# Patient Record
Sex: Female | Born: 1945 | Race: White | Hispanic: No | Marital: Single
Health system: Southern US, Community
[De-identification: ages and names within clinical notes are randomized; demographics above are authoritative.]

---

## 2004-05-31 ENCOUNTER — Emergency Department: Payer: Self-pay | Admitting: Emergency Medicine

## 2004-06-07 ENCOUNTER — Ambulatory Visit: Payer: Self-pay | Admitting: Unknown Physician Specialty

## 2004-11-08 ENCOUNTER — Emergency Department: Payer: Self-pay | Admitting: Emergency Medicine

## 2005-11-17 ENCOUNTER — Emergency Department: Payer: Self-pay | Admitting: Emergency Medicine

## 2006-02-09 ENCOUNTER — Emergency Department (HOSPITAL_COMMUNITY): Admission: EM | Admit: 2006-02-09 | Discharge: 2006-02-09 | Payer: Self-pay | Admitting: Emergency Medicine

## 2006-02-10 ENCOUNTER — Emergency Department: Payer: Self-pay | Admitting: Emergency Medicine

## 2006-09-01 ENCOUNTER — Ambulatory Visit: Payer: Self-pay | Admitting: Cardiology

## 2006-09-01 ENCOUNTER — Inpatient Hospital Stay (HOSPITAL_COMMUNITY): Admission: EM | Admit: 2006-09-01 | Discharge: 2006-09-10 | Payer: Self-pay | Admitting: Emergency Medicine

## 2006-09-09 ENCOUNTER — Ambulatory Visit: Payer: Self-pay | Admitting: Physical Medicine & Rehabilitation

## 2006-10-22 ENCOUNTER — Emergency Department: Payer: Self-pay | Admitting: Emergency Medicine

## 2006-10-22 ENCOUNTER — Other Ambulatory Visit: Payer: Self-pay

## 2006-12-02 ENCOUNTER — Inpatient Hospital Stay: Payer: Self-pay | Admitting: Internal Medicine

## 2006-12-27 ENCOUNTER — Emergency Department: Payer: Self-pay | Admitting: Emergency Medicine

## 2007-12-09 ENCOUNTER — Ambulatory Visit: Payer: Self-pay | Admitting: Pain Medicine

## 2008-05-28 ENCOUNTER — Emergency Department: Payer: Self-pay | Admitting: Emergency Medicine

## 2008-05-29 ENCOUNTER — Emergency Department: Payer: Self-pay | Admitting: Emergency Medicine

## 2008-05-31 ENCOUNTER — Emergency Department: Payer: Self-pay | Admitting: Emergency Medicine

## 2008-06-01 ENCOUNTER — Emergency Department: Payer: Self-pay | Admitting: Emergency Medicine

## 2008-09-02 ENCOUNTER — Emergency Department: Payer: Self-pay | Admitting: Emergency Medicine

## 2010-03-21 ENCOUNTER — Emergency Department: Payer: Self-pay | Admitting: Emergency Medicine

## 2010-11-18 ENCOUNTER — Emergency Department: Payer: Self-pay | Admitting: Emergency Medicine

## 2010-11-21 ENCOUNTER — Emergency Department: Payer: Self-pay | Admitting: Unknown Physician Specialty

## 2011-04-28 ENCOUNTER — Emergency Department: Payer: Self-pay | Admitting: Emergency Medicine

## 2011-06-01 ENCOUNTER — Emergency Department: Payer: Self-pay | Admitting: Emergency Medicine

## 2011-08-19 ENCOUNTER — Emergency Department: Payer: Self-pay | Admitting: Emergency Medicine

## 2011-08-29 ENCOUNTER — Emergency Department: Payer: Self-pay | Admitting: Emergency Medicine

## 2011-08-29 LAB — URINALYSIS, COMPLETE
Glucose,UR: NEGATIVE mg/dL (ref 0–75)
Ketone: NEGATIVE
Protein: NEGATIVE
RBC,UR: 2 /HPF (ref 0–5)
Specific Gravity: 1.02 (ref 1.003–1.030)
WBC UR: <1 /HPF (ref 0–5)

## 2011-08-29 LAB — COMPREHENSIVE METABOLIC PANEL
Calcium, Total: 8.3 mg/dL — ABNORMAL LOW (ref 8.5–10.1)
Chloride: 110 mmol/L — ABNORMAL HIGH (ref 98–107)
Co2: 21 mmol/L (ref 21–32)
EGFR (Non-African Amer.): >60
Glucose: 82 mg/dL (ref 65–99)
Osmolality: 285 (ref 275–301)
Potassium: 4 mmol/L (ref 3.5–5.1)
SGOT(AST): 27 U/L (ref 15–37)
Sodium: 143 mmol/L (ref 136–145)

## 2011-08-29 LAB — CBC WITH DIFFERENTIAL/PLATELET
Eosinophil #: 0.1 10*3/uL (ref 0.0–0.7)
Eosinophil %: 3 %
HGB: 10.9 g/dL — ABNORMAL LOW (ref 12.0–16.0)
Lymphocyte #: 1.1 10*3/uL (ref 1.0–3.6)
Lymphocyte %: 23.5 %
Monocyte #: 0.4 10*3/uL (ref 0.0–0.7)
Neutrophil %: 63.8 %
Platelet: 293 10*3/uL (ref 150–440)
RBC: 3.52 10*6/uL — ABNORMAL LOW (ref 3.80–5.20)

## 2011-08-29 LAB — CK TOTAL AND CKMB (NOT AT ARMC): CK-MB: 3.8 ng/mL — ABNORMAL HIGH (ref 0.5–3.6)

## 2011-08-29 LAB — TROPONIN I: Troponin-I: <0.02 ng/mL

## 2011-09-14 ENCOUNTER — Emergency Department: Payer: Self-pay | Admitting: Emergency Medicine

## 2011-09-14 LAB — BASIC METABOLIC PANEL
Anion Gap: 9 (ref 7–16)
Chloride: 111 mmol/L — ABNORMAL HIGH (ref 98–107)
Creatinine: 0.72 mg/dL (ref 0.60–1.30)
EGFR (African American): >60
Osmolality: 274 (ref 275–301)

## 2011-09-14 LAB — CBC
HCT: 35.1 % (ref 35.0–47.0)
HGB: 11.3 g/dL — ABNORMAL LOW (ref 12.0–16.0)
MCV: 96 fL (ref 80–100)
Platelet: 440 10*3/uL (ref 150–440)
RBC: 3.68 10*6/uL — ABNORMAL LOW (ref 3.80–5.20)
WBC: 5.5 10*3/uL (ref 3.6–11.0)

## 2012-03-04 ENCOUNTER — Emergency Department: Payer: Self-pay | Admitting: Emergency Medicine

## 2012-03-04 LAB — CBC
HCT: 31.9 % — ABNORMAL LOW (ref 35.0–47.0)
HGB: 10.8 g/dL — ABNORMAL LOW (ref 12.0–16.0)
MCHC: 33.8 g/dL (ref 32.0–36.0)

## 2012-03-04 LAB — COMPREHENSIVE METABOLIC PANEL
Anion Gap: 7 (ref 7–16)
BUN: 15 mg/dL (ref 7–18)
Bilirubin,Total: 0.2 mg/dL (ref 0.2–1.0)
Chloride: 107 mmol/L (ref 98–107)
Creatinine: 0.7 mg/dL (ref 0.60–1.30)
EGFR (African American): >60
Osmolality: 275 (ref 275–301)
Potassium: 3.4 mmol/L — ABNORMAL LOW (ref 3.5–5.1)
SGOT(AST): 15 U/L (ref 15–37)
Sodium: 137 mmol/L (ref 136–145)
Total Protein: 7.1 g/dL (ref 6.4–8.2)

## 2012-03-04 LAB — CK TOTAL AND CKMB (NOT AT ARMC)
CK, Total: 33 U/L (ref 21–215)
CK-MB: 1.5 ng/mL (ref 0.5–3.6)

## 2012-03-11 LAB — CBC WITH DIFFERENTIAL/PLATELET
Basophil %: 2.8 %
Eosinophil %: 3.6 %
HCT: 29.9 % — ABNORMAL LOW (ref 35.0–47.0)
MCHC: 31.5 g/dL — ABNORMAL LOW (ref 32.0–36.0)
Monocyte #: 0.5 x10 3/mm (ref 0.2–0.9)
Monocyte %: 6.5 %
Neutrophil %: 68.6 %
RDW: 21.4 % — ABNORMAL HIGH (ref 11.5–14.5)
WBC: 8.2 10*3/uL (ref 3.6–11.0)

## 2012-03-12 ENCOUNTER — Inpatient Hospital Stay: Payer: Self-pay | Admitting: Specialist

## 2012-03-12 LAB — COMPREHENSIVE METABOLIC PANEL
Alkaline Phosphatase: 91 U/L (ref 50–136)
Anion Gap: 5 — ABNORMAL LOW (ref 7–16)
Chloride: 111 mmol/L — ABNORMAL HIGH (ref 98–107)
Co2: 26 mmol/L (ref 21–32)
EGFR (African American): >60
SGOT(AST): 21 U/L (ref 15–37)
SGPT (ALT): 12 U/L (ref 12–78)

## 2012-03-12 LAB — URINALYSIS, COMPLETE
Bilirubin,UR: NEGATIVE
Nitrite: NEGATIVE
Ph: 5 (ref 4.5–8.0)
Protein: NEGATIVE
RBC,UR: 2 /HPF (ref 0–5)
Squamous Epithelial: 1

## 2012-03-12 LAB — PROTIME-INR: Prothrombin Time: 12.5 secs (ref 11.5–14.7)

## 2012-03-12 LAB — ETHANOL: Ethanol %: <0.003 % (ref 0.000–0.080)

## 2012-03-28 ENCOUNTER — Emergency Department: Payer: Self-pay | Admitting: Emergency Medicine

## 2012-03-28 LAB — CBC WITH DIFFERENTIAL/PLATELET
Basophil %: 1.2 %
Eosinophil %: 0.2 %
HCT: 31.3 % — ABNORMAL LOW (ref 35.0–47.0)
Lymphocyte %: 10.6 %
MCH: 29.5 pg (ref 26.0–34.0)
MCHC: 31.9 g/dL — ABNORMAL LOW (ref 32.0–36.0)
MCV: 93 fL (ref 80–100)
Monocyte %: 5.5 %
RDW: 21.4 % — ABNORMAL HIGH (ref 11.5–14.5)

## 2012-03-28 LAB — COMPREHENSIVE METABOLIC PANEL
Albumin: 2.9 g/dL — ABNORMAL LOW (ref 3.4–5.0)
Alkaline Phosphatase: 141 U/L — ABNORMAL HIGH (ref 50–136)
Anion Gap: 10 (ref 7–16)
Calcium, Total: 7.8 mg/dL — ABNORMAL LOW (ref 8.5–10.1)
Co2: 20 mmol/L — ABNORMAL LOW (ref 21–32)
EGFR (Non-African Amer.): 53 — ABNORMAL LOW
Glucose: 98 mg/dL (ref 65–99)
Osmolality: 284 (ref 275–301)
Potassium: 3.2 mmol/L — ABNORMAL LOW (ref 3.5–5.1)
SGOT(AST): 24 U/L (ref 15–37)
Sodium: 140 mmol/L (ref 136–145)

## 2012-03-28 LAB — DRUG SCREEN, URINE
Amphetamines, Ur Screen: NEGATIVE (ref ?–1000)
Barbiturates, Ur Screen: POSITIVE (ref ?–200)
Cocaine Metabolite,Ur ~~LOC~~: NEGATIVE (ref ?–300)
MDMA (Ecstasy)Ur Screen: NEGATIVE (ref ?–500)
Opiate, Ur Screen: NEGATIVE (ref ?–300)
Tricyclic, Ur Screen: NEGATIVE (ref ?–1000)

## 2012-03-29 ENCOUNTER — Emergency Department: Payer: Self-pay | Admitting: Internal Medicine

## 2012-03-31 ENCOUNTER — Emergency Department: Payer: Self-pay | Admitting: Emergency Medicine

## 2012-04-02 ENCOUNTER — Emergency Department: Payer: Self-pay | Admitting: Emergency Medicine

## 2012-04-02 LAB — URINALYSIS, COMPLETE
Leukocyte Esterase: NEGATIVE
Ph: 5 (ref 4.5–8.0)
Protein: NEGATIVE
RBC,UR: 6 /HPF (ref 0–5)

## 2012-04-02 LAB — COMPREHENSIVE METABOLIC PANEL
Anion Gap: 9 (ref 7–16)
Bilirubin,Total: 0.1 mg/dL — ABNORMAL LOW (ref 0.2–1.0)
Calcium, Total: 8.2 mg/dL — ABNORMAL LOW (ref 8.5–10.1)
Chloride: 107 mmol/L (ref 98–107)
Co2: 25 mmol/L (ref 21–32)
EGFR (African American): >60
EGFR (Non-African Amer.): >60
Osmolality: 280 (ref 275–301)

## 2012-04-02 LAB — CBC
HCT: 31.3 % — ABNORMAL LOW (ref 35.0–47.0)
HGB: 10.3 g/dL — ABNORMAL LOW (ref 12.0–16.0)
MCH: 30.4 pg (ref 26.0–34.0)
MCHC: 32.9 g/dL (ref 32.0–36.0)
RDW: 21.5 % — ABNORMAL HIGH (ref 11.5–14.5)

## 2012-04-02 LAB — DRUG SCREEN, URINE
Benzodiazepine, Ur Scrn: POSITIVE (ref ?–200)
Cannabinoid 50 Ng, Ur ~~LOC~~: NEGATIVE (ref ?–50)
MDMA (Ecstasy)Ur Screen: NEGATIVE (ref ?–500)
Tricyclic, Ur Screen: NEGATIVE (ref ?–1000)

## 2012-08-06 ENCOUNTER — Emergency Department: Payer: Self-pay | Admitting: Emergency Medicine

## 2012-10-04 DEATH — deceased

## 2014-04-20 IMAGING — CT CT ABD-PELV W/ CM
1 of 3 series · 13 of 32 positions shown, 18 images · IV contrast (isovue)
Comparison: none

REASON FOR EXAM: (1) abd pain; (2) abd paiin
COMMENTS:

PROCEDURE:     CT  - CT ABDOMEN / PELVIS  W  - March 04, 2012  [DATE]
RESULT:     Comparison:  12/06/2006
TECHNIQUE: Multiple axial images of the abdomen and pelvis were performed
from the lung bases to the pubic symphysis, without p.o. contrast and with
85 mL of Isovue 300 intravenous contrast. The patient was unable to tolerate
oral contrast.

[Series 2: 3mm soft tissue · axial · 0.65mm/px · z∈[-426,-98]mm · 13 of 123 slices shown, 18 images]
[im 7/123  soft-tissue]
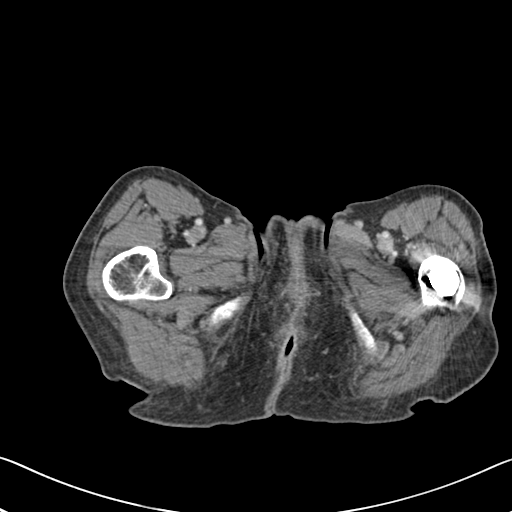
[im 7/123  bone]
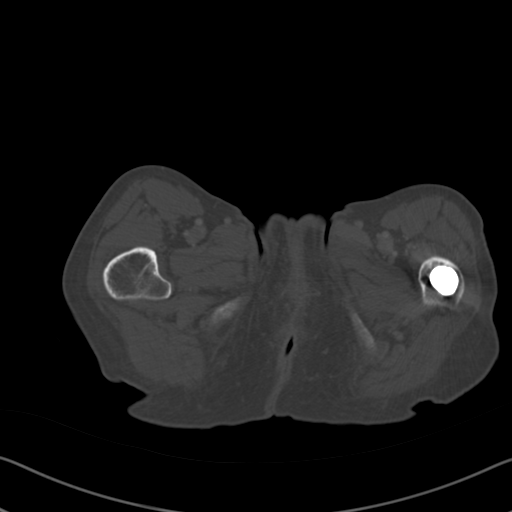
[im 21/123  soft-tissue]
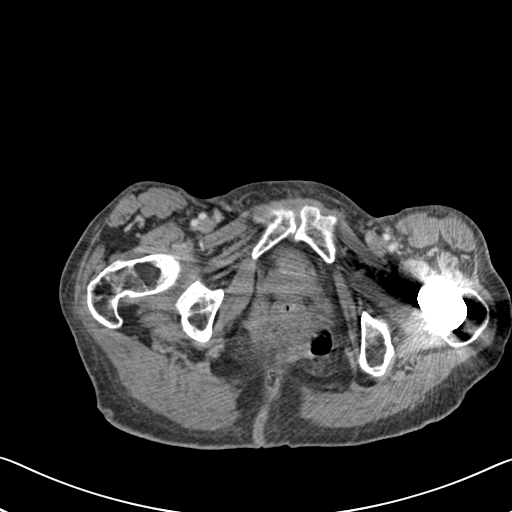
[im 28/123  soft-tissue]
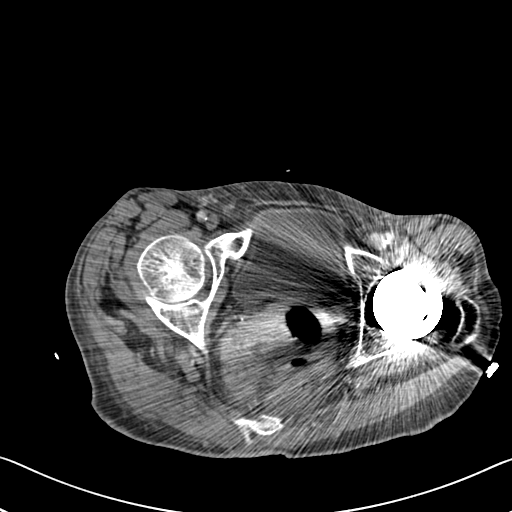
[im 34/123  soft-tissue]
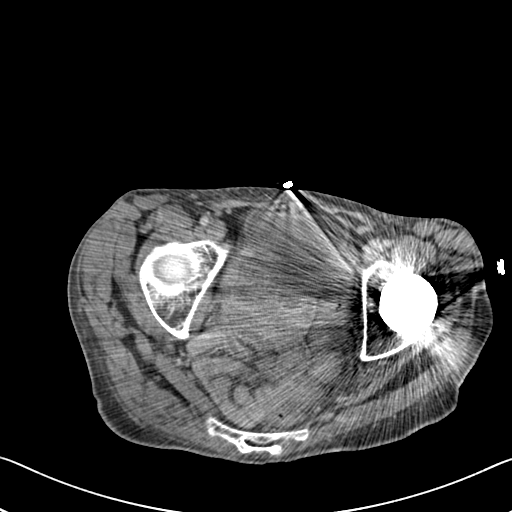
[im 48/123  soft-tissue]
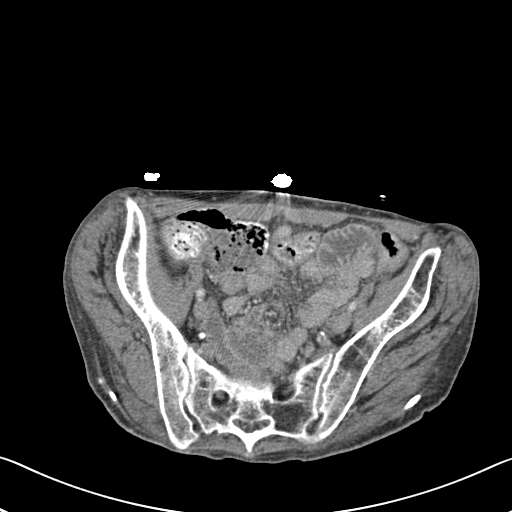
[im 55/123  soft-tissue]
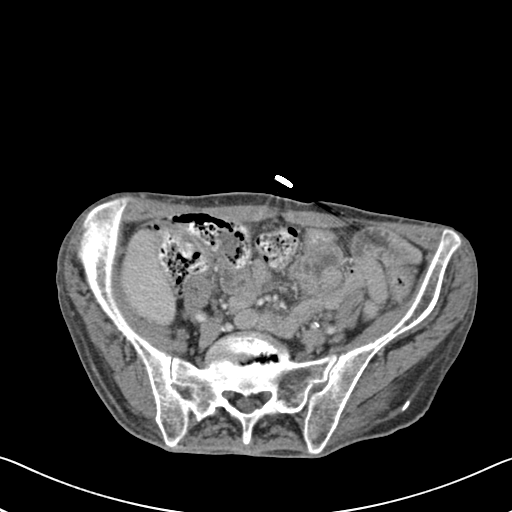
[im 68/123  soft-tissue]
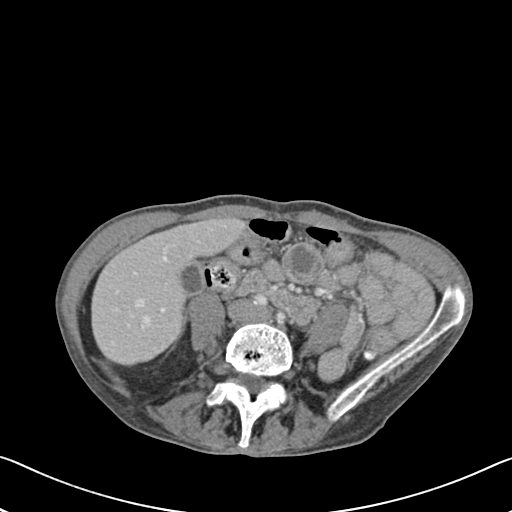
[im 75/123  soft-tissue]
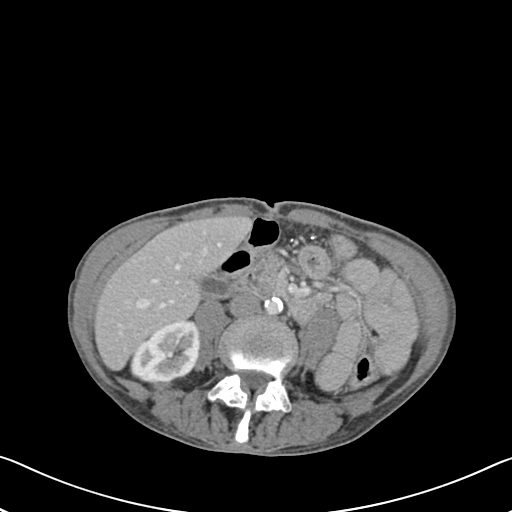
[im 89/123  soft-tissue]
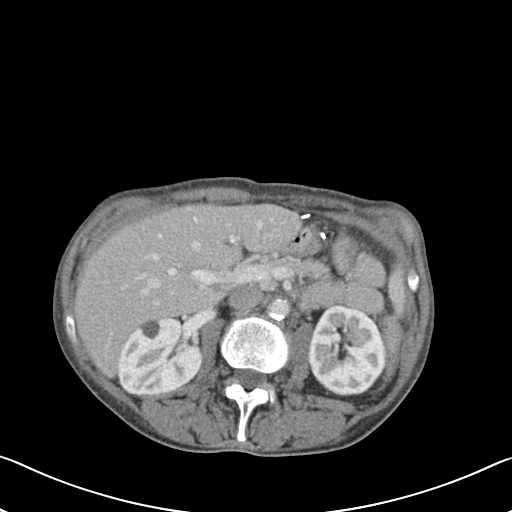
[im 89/123  bone]
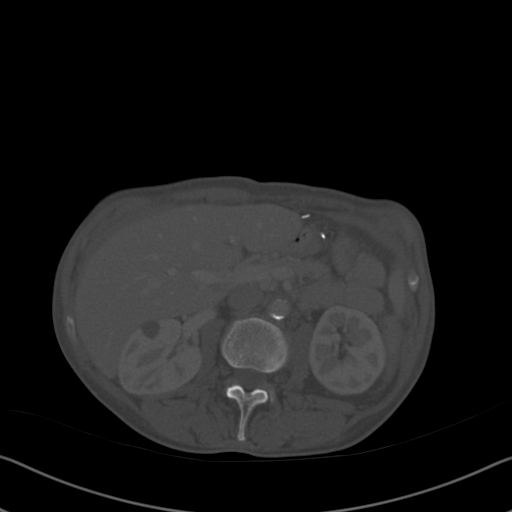
[im 95/123  soft-tissue]
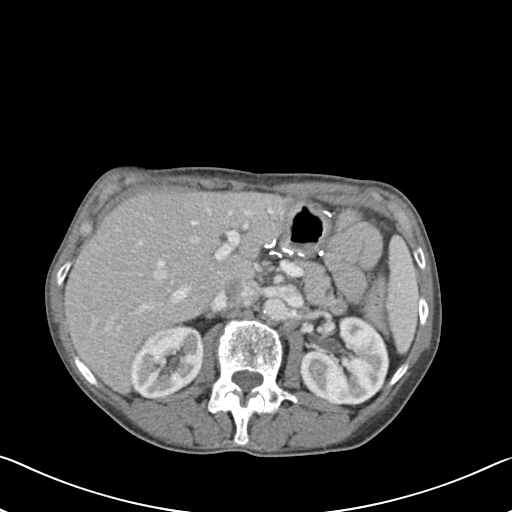
[im 95/123  lung]
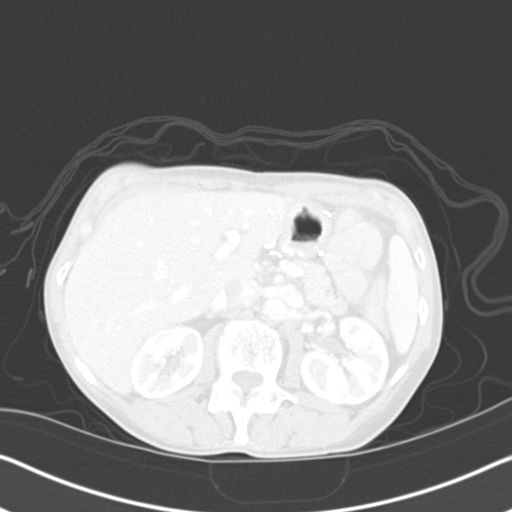
[im 102/123  soft-tissue]
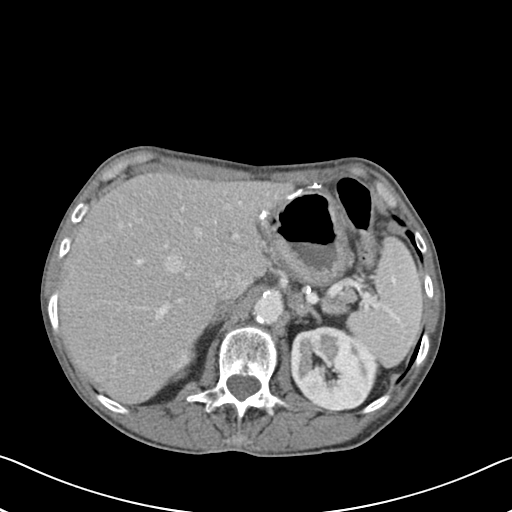
[im 102/123  lung]
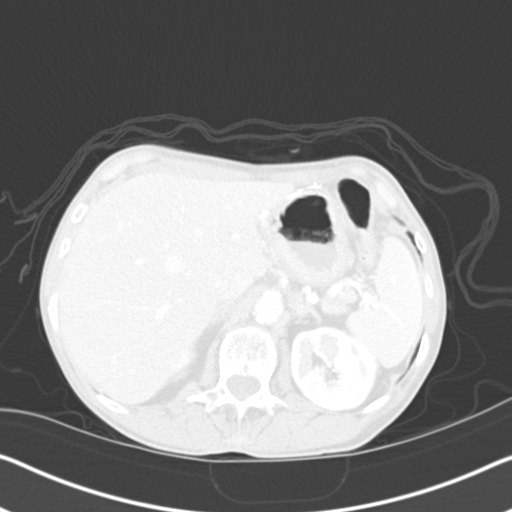
[im 109/123  lung]
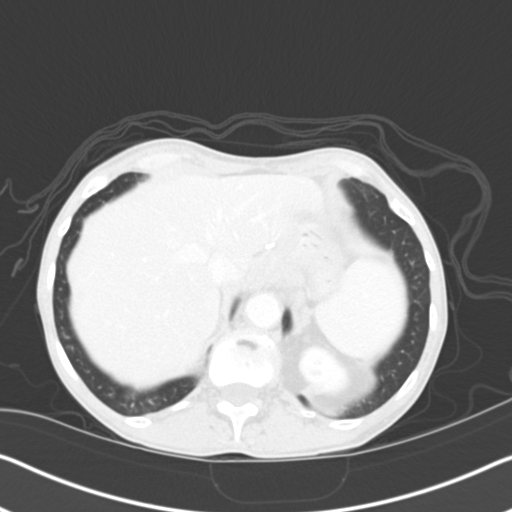
[im 116/123  soft-tissue]
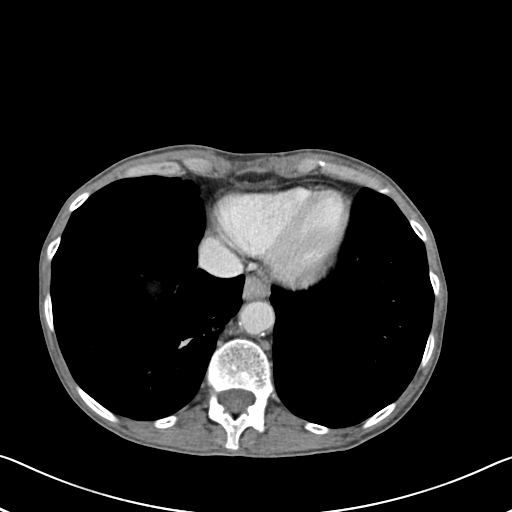
[im 116/123  lung]
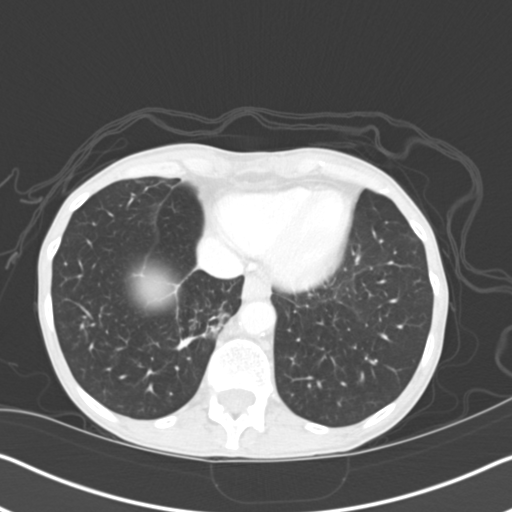

[13 of 32 positions shown; findings below may reference images not displayed]

FINDINGS: There are are a few round foci of bright left opacity in the inferior right
lower lobe. The largest measures 11 mm in diameter. The into the lower lobes
is significantly improved from the prior abdominal CT. Minimal opacity on
the medial aspect of the right lower lobe is like a secondary to atelectasis
or scarring.

The liver is somewhat enlarged, extending into the pelvis, but enhances
normally. There is mild central intrahepatic biliary ductal dilatation in
the left hepatic lobe. The gallbladder, spleen, and adrenals are
unremarkable. There is a small low-attenuation structure in the body of the
pancreas seen on image 43. It measures 1.3 x 0.6 cm.  Postoperative changes
are seen about the gastric body and fundus. Mild wall thickening of the
gastric body and fundus may be related to underdistention or gastritis.
Small low-attenuation lesion in the right kidney likely represents a cyst.

Evaluation of the pelvis is limited by streak artifact from the left hip
arthroplasty hardware. The patient is status post hysterectomy. The small
and large bowel are normal in caliber. There are a few diverticula in the
sigmoid colon. The appendix is not identified. However, there are no
inflammatory changes in the right lower quadrant.

No aggressive lytic or sclerotic osseous lesions are identified.
IMPRESSION: 1. There are postoperative changes of the gastric body and fundus. Mild wall
thickening of the gastric body and fundus may be secondary to
underdistention or gastritis. An infiltrating disorder is not excluded.
Further evaluation could be provide with endoscopy, as indicated.
2. Small cystic structure in the pancreatic body is too small to
characterize. Possibilities would include a small pseudocyst, focal
dilatation of a pancreatic sidebranch, as well as a small cystic neoplasm.
Followup contrast enhanced abdominal CT is recommended in 6 months.
3. There is mild central intrahepatic biliary ductal dilatation in the left
hepatic lobe, which is similar to the prior CT in 7990. Correlate with
laboratory values.
4. The patient appears relatively cachectic.
5. There are multiple ground groundglass nodular densities in the right
lower lobe. These may be infectious or inflammatory. However, followup
noncontrast chest CT is recommended in 3 months to ensure resolution.

[REDACTED]

## 2014-11-23 NOTE — Consult Note (Signed)
PATIENT NAME:  Sharon Glenn, Sharon Glenn MR#:  [id number] DATE OF BIRTH:  Sep 21, 1945  DATE OF CONSULTATION:  03/12/2012  REFERRING PHYSICIAN:  Dr. Myra Rude  CONSULTING PHYSICIAN:  Starleen Arms, MD  PRIMARY CARE PHYSICIAN: Dr. Evelene Croon   REASON FOR CONSULTATION: Preoperative evaluation for possible surgical intervention for right hip intertrochanteric hip fracture.   HISTORY OF PRESENT ILLNESS: This is a 69 year old female with significant past medical history of chronic back pain on chronic narcotics, history of chronic obstructive pulmonary disease, not on home oxygen and history of peptic ulcer disease status post partial gastrectomy and history of left hip replacement presents status post mechanical fall, patient reports was walking to the kitchen where she tripped and fell where she hurt her right side hip where she was complaining of significant pain. Patient came to ED where she was found to have intertrochanteric right hip fracture on x-ray. Patient had CT head which did not show any evidence of intracranial hemorrhage or skull fracture. Patient was admitted by orthopedic service who requested hospitalist consult for preop evaluation and medical management. Patient is known to have history of chronic obstructive pulmonary disease. Denies any worsening shortness of breath, any wheezing, any productive cough, not on home oxygen. Patient was found to have right bundle branch block on EKG once compared to previous EKG appears to be chronic. Patient denies any chest pain, palpitation, worsening shortness of breath. As well patient denies any loss of consciousness, altered mental status or syncope.   PAST MEDICAL HISTORY:  1. Osteoporosis with history of multiple broken bones.  2. History of chronic back pain on chronic narcotics.  3. History of chronic obstructive pulmonary disease.  4. Peptic ulcer disease status post partial gastrectomy.  5. History of migraine headache.   PAST  SURGICAL HISTORY:  1. Hysterectomy.  2. Partial gastrectomy.  3. Back surgery.  4. Left hip replacement.   HOME MEDICATIONS:  1. Zyrtec 10 mg daily.  2. Simvastatin 20 mg. 3. Vistaril 25 mg.  4. Combivent 18 mcg 103 inhalational 4 times a day.  5. Hydrochlorothiazide 25 mg daily.  6. Levaquin 500 mg.   ALLERGIES: Morphine causing altered mental status. Mycins causing rash.   FAMILY HISTORY: Father died at age of 17 from pneumonia. Mother died at age of 64 from pneumonia. No history of heart disease or cancer.   SOCIAL HISTORY: Patient is divorced. Smokes up to five cigarettes per day. Denies any alcohol or IV drug abuse.   REVIEW OF SYSTEMS: CONSTITUTIONAL: Patient denies any fever, fatigue, weakness. EYES: Denies blurry vision, double vision or pain. ENT: Denies tinnitus, ear pain, hearing loss. RESPIRATORY: Denies any cough, wheezing, hemoptysis, dyspnea. CARDIOVASCULAR: Denies chest pain, orthopnea, edema, arrhythmia, palpitation. GASTROINTESTINAL: Denies nausea, vomiting, diarrhea, abdominal pain. GENITOURINARY: Denies dysuria, hematuria, renal colic. ENDO: Denies any nocturia, polyuria, heat or cold intolerance. HEMATOLOGY: Denies anemia, easy bruising, bleeding diathesis. INTEGUMENTARY: Denies any acne or rash or skin lesions. NEUROLOGICAL: Denies any numbness, weakness, dysarthria, epilepsy, tremors, vertigo. MUSCULOSKELETAL: Complaints of right hip pain and chronic lower back pain. Denies any swelling, cramps or gout. PSYCH: Denies any anxiety, schizophrenia or bipolar disorder.   PHYSICAL EXAMINATION:  VITAL SIGNS: Temperature 97, pulse 61, respiratory 22, blood pressure 104/57, saturating 98% on oxygen.   GENERAL: Malnourished female, comfortable in bed in no apparent distress.   HEENT: Head atraumatic, normocephalic. Pupils equal, reactive to light. Pink conjunctivae. Anicteric sclerae. Moist oral mucosa.   NECK: Supple. No thyromegaly. No JVD.   CHEST:  Good air entry  bilaterally. No wheezing, rales, rhonchi.   CARDIOVASCULAR: S1, S2 heard. No rubs, murmur, gallops.   ABDOMEN: Soft, nontender, nondistended. Bowel sounds present.   EXTREMITIES: No edema. No clubbing. No cyanosis.   NEUROLOGIC: Cranial nerves grossly intact. Right lower extremity movement limited secondary to pain.   PSYCHIATRIC: Appropriate affect. Awake, alert x3. Intact judgment and insight.   LABORATORY, DIAGNOSTIC AND RADIOLOGICAL DATA: Glucose 91, BUN 24, creatinine 0.71, sodium 142, potassium 4.2, chloride 111, CO2 26, white blood cells 8.2, hemoglobin 9.4, hematocrit 29.9, platelets 352. Urinalysis +2 for leukocyte esterase and white blood cells 9. EKG showing normal sinus rhythm, right bundle branch block which is old once compared to previous.   ASSESSMENT AND PLAN:  221. 69 year old female status post mechanical fall with right hip fracture being planned for surgical intervention by ortho in a.m. Patient has known history of chronic obstructive pulmonary disease which does not appear to be in chronic obstructive pulmonary disease exacerbation, has no complaints of chest pain, palpitation. Patient has known history of chronic pain syndrome with chronic narcotics. Patient is intermediate risk for surgical intervention. 2. History of chronic obstructive pulmonary disease. Will start on DuoNebs every four hours and p.r.n. albuterol.  3. Hyperlipidemia. Continue with statin.  4. Hypertension. Currently blood pressure is frail, controlled. Will hold on resuming hydrochlorothiazide.  5. Urinary tract infection. Even though patient denies any dysuria, has positive urinalysis with positive leukocyte esterase trace. Will start her on IV Levaquin.  6. Chronic pain syndrome. Currently patient will be contained on p.r.n. Percocet and Dilaudid.  7. Will start patient on sub-Q heparin post surgery. Will continue with SCDs for deep vein thrombosis prophylaxis. Will have on Protonix for GI prophylaxis.     TOTAL TIME SPENT ON PATIENT CARE: 45 minutes.   ____________________________ Starleen Armsawood S. Hersel Mcmeen, MD dse:cms D: 03/12/2012 02:20:49 ET T: 03/12/2012 10:45:02 ET JOB#: [id number]  cc: Starleen Armsawood S. Natsha Guidry, MD, <Dictator> [place]t A. Lacie ScottsNiemeyer, MD Samaia Iwata Teena IraniS Carlon Davidson MD ELECTRONICALLY SIGNED 03/13/2012 0:16

## 2014-11-23 NOTE — Discharge Summary (Signed)
PATIENT NAME:  Sharon StarringCHILTON, Rai W MR#:  [id number] DATE OF BIRTH:  05/12/1946  DATE OF ADMISSION:  03/12/2012 DATE OF DISCHARGE:  03/12/2012  DISCHARGE DIAGNOSES:  1. Right intertrochanteric hip fracture.  2. Chronic obstructive pulmonary disease.  3. Chronic pain syndrome.  4. Altered mental status.  5. Osteoporosis.  6. Hypertension.  7. History of migraine headaches.  8. Tobacco use disorder.  OPERATIONS/PROCEDURES: None.  HOSPITAL COURSE: This patient was admitted through the Emergency Room with a nondisplaced right intertrochanteric hip fracture. She did have altered mental status in the Emergency Room which was felt to be from long term analgesic medication use. CT scan of her head demonstrated no acute abnormalities. The patient was admitted and gradually regained a reasonably normal mental status. Her vital signs were intact at all times and she was at no time unstable. The patient [facility] and on the evening of admission signed out AGAINST MEDICAL ADVICE and was [facility].  ____________________________ Clare Gandyhristopher E. Lashina Milles, MD ces:cms D: 03/21/2012 08:38:53 ET T: 03/21/2012 10:34:39 ET JOB#: [id number]  cc: Clare Gandyhristopher E. Dyllin Gulley, MD, <Dictator>  Clare GandyHRISTOPHER E Corderius Saraceni MD ELECTRONICALLY SIGNED 03/27/2012 9:51
